# Patient Record
Sex: Female | Born: 1995 | Race: White | Hispanic: No | Marital: Single | Smoking: Current every day smoker
Health system: Southern US, Community
[De-identification: ages and names within clinical notes are randomized; demographics above are authoritative.]

## PROBLEM LIST (undated history)

## (undated) HISTORY — PX: TONSILLECTOMY: SUR1361

---

## 2015-11-22 ENCOUNTER — Encounter: Payer: Self-pay | Admitting: Emergency Medicine

## 2015-11-22 ENCOUNTER — Emergency Department: Payer: Self-pay

## 2015-11-22 ENCOUNTER — Emergency Department
Admission: EM | Admit: 2015-11-22 | Discharge: 2015-11-22 | Disposition: A | Discharge status: Home / Self Care | Payer: Self-pay | Attending: Emergency Medicine | Admitting: Emergency Medicine

## 2015-11-22 DIAGNOSIS — Y9339 Activity, other involving climbing, rappelling and jumping off: Secondary | ICD-10-CM | POA: Insufficient documentation

## 2015-11-22 DIAGNOSIS — Y999 Unspecified external cause status: Secondary | ICD-10-CM | POA: Insufficient documentation

## 2015-11-22 DIAGNOSIS — F17200 Nicotine dependence, unspecified, uncomplicated: Secondary | ICD-10-CM | POA: Insufficient documentation

## 2015-11-22 DIAGNOSIS — F1290 Cannabis use, unspecified, uncomplicated: Secondary | ICD-10-CM | POA: Insufficient documentation

## 2015-11-22 DIAGNOSIS — S92125A Nondisplaced fracture of body of left talus, initial encounter for closed fracture: Secondary | ICD-10-CM | POA: Insufficient documentation

## 2015-11-22 DIAGNOSIS — W1789XA Other fall from one level to another, initial encounter: Secondary | ICD-10-CM | POA: Insufficient documentation

## 2015-11-22 DIAGNOSIS — Y92018 Other place in single-family (private) house as the place of occurrence of the external cause: Secondary | ICD-10-CM | POA: Insufficient documentation

## 2015-11-22 DIAGNOSIS — F1910 Other psychoactive substance abuse, uncomplicated: Secondary | ICD-10-CM

## 2015-11-22 DIAGNOSIS — Z5181 Encounter for therapeutic drug level monitoring: Secondary | ICD-10-CM | POA: Insufficient documentation

## 2015-11-22 DIAGNOSIS — S92102A Unspecified fracture of left talus, initial encounter for closed fracture: Secondary | ICD-10-CM

## 2015-11-22 LAB — URINALYSIS COMPLETE WITH MICROSCOPIC (ARMC ONLY)
BACTERIA UA: NONE SEEN
BILIRUBIN URINE: NEGATIVE
GLUCOSE, UA: NEGATIVE mg/dL
HGB URINE DIPSTICK: NEGATIVE
Ketones, ur: NEGATIVE mg/dL
Leukocytes, UA: NEGATIVE
Nitrite: NEGATIVE
PH: 6 (ref 5.0–8.0)
Protein, ur: 30 mg/dL — AB
Specific Gravity, Urine: 1.021 (ref 1.005–1.030)

## 2015-11-22 LAB — URINE DRUG SCREEN, QUALITATIVE (ARMC ONLY)
AMPHETAMINES, UR SCREEN: NOT DETECTED
BARBITURATES, UR SCREEN: NOT DETECTED
BENZODIAZEPINE, UR SCRN: POSITIVE — AB
Cannabinoid 50 Ng, Ur ~~LOC~~: POSITIVE — AB
Cocaine Metabolite,Ur ~~LOC~~: POSITIVE — AB
MDMA (Ecstasy)Ur Screen: NOT DETECTED
METHADONE SCREEN, URINE: NOT DETECTED
OPIATE, UR SCREEN: POSITIVE — AB
Phencyclidine (PCP) Ur S: NOT DETECTED
TRICYCLIC, UR SCREEN: NOT DETECTED

## 2015-11-22 LAB — POCT PREGNANCY, URINE: Preg Test, Ur: NEGATIVE

## 2015-11-22 MED ORDER — IBUPROFEN 200 MG PO TABS: 600.0000 mg | ORAL_TABLET | Freq: Four times a day (QID) | ORAL | 0 refills | Status: AC | PRN

## 2015-11-22 MED ORDER — KETOROLAC TROMETHAMINE 30 MG/ML IJ SOLN
30.0000 mg | Freq: Once | INTRAMUSCULAR | Status: AC
  Administered 2015-11-22: 30 mg via INTRAVENOUS
  Filled 2015-11-22: qty 1
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
## 2015-11-22 NOTE — ED Notes (Signed)
Patient transported to CT 

## 2017-02-06 IMAGING — CR DG TIBIA/FIBULA 2V*L*
4 series · 4 of 4 positions shown · non-contrast
Comparison: None.

CLINICAL DATA: Jumped from second floor apartment. Pain and lower
extremity.

EXAM:
LEFT TIBIA AND FIBULA - 2 VIEW

[tibia ap (1 of 2)]
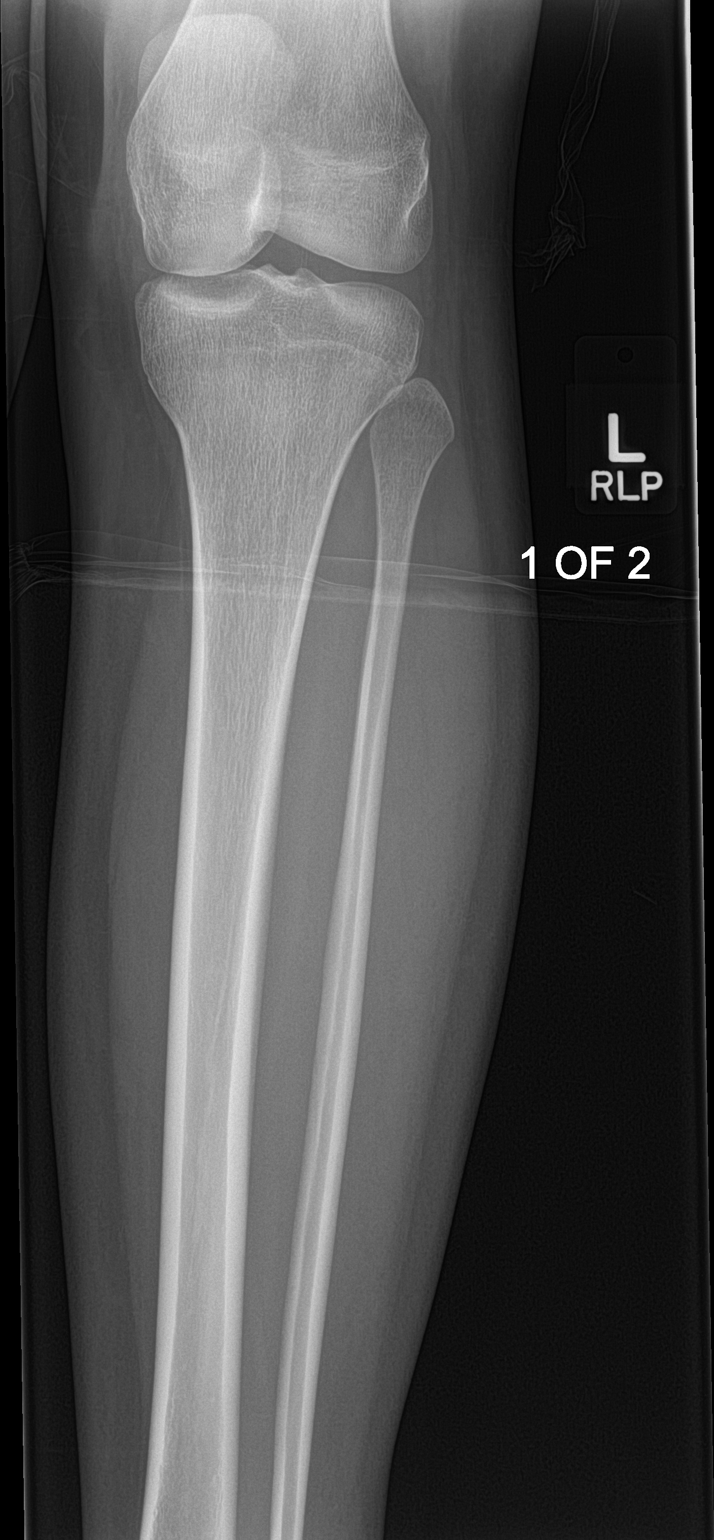

[tibia ap (2 of 2)]
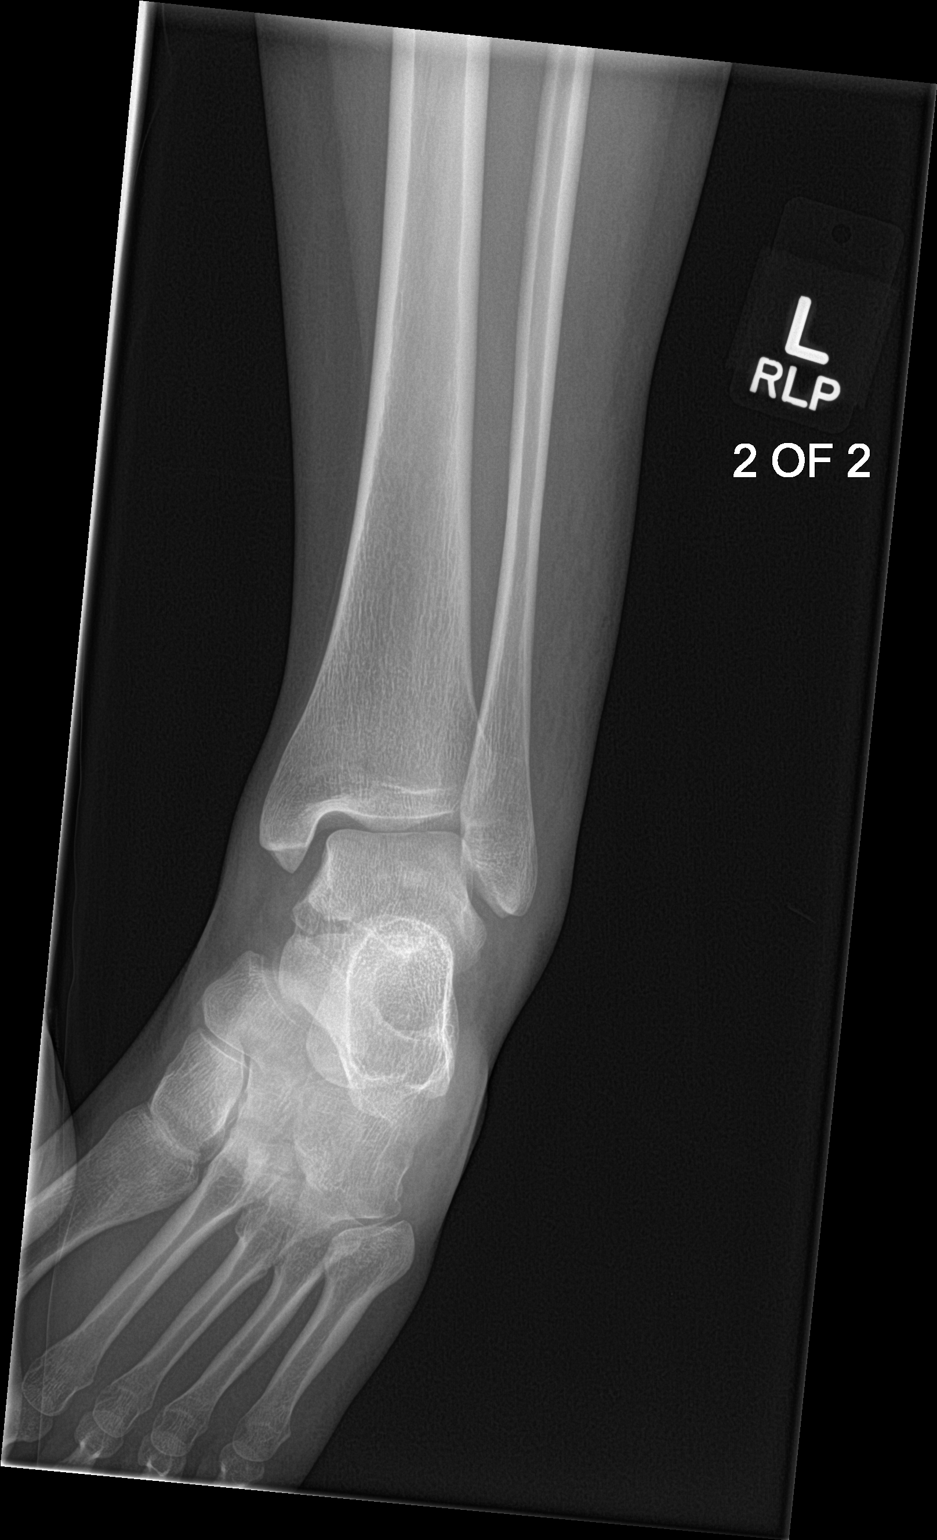

[tibia lat]
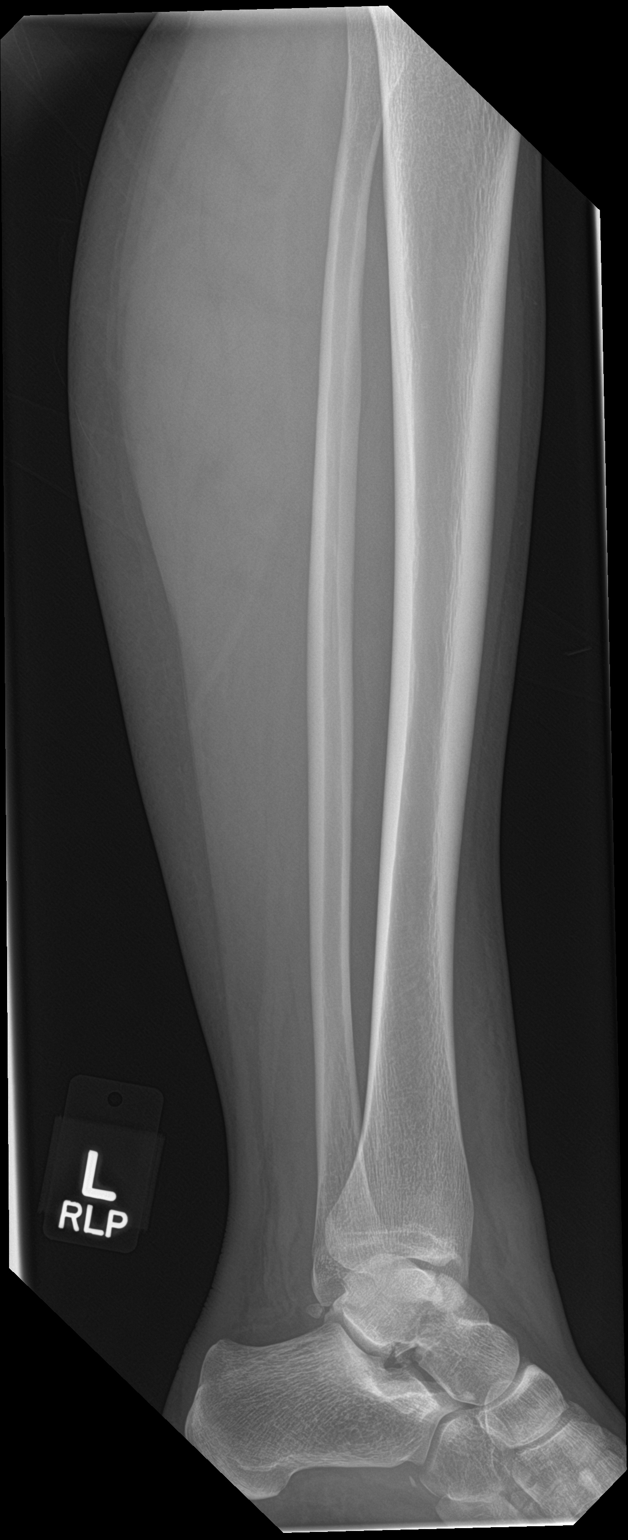

[femur lat]
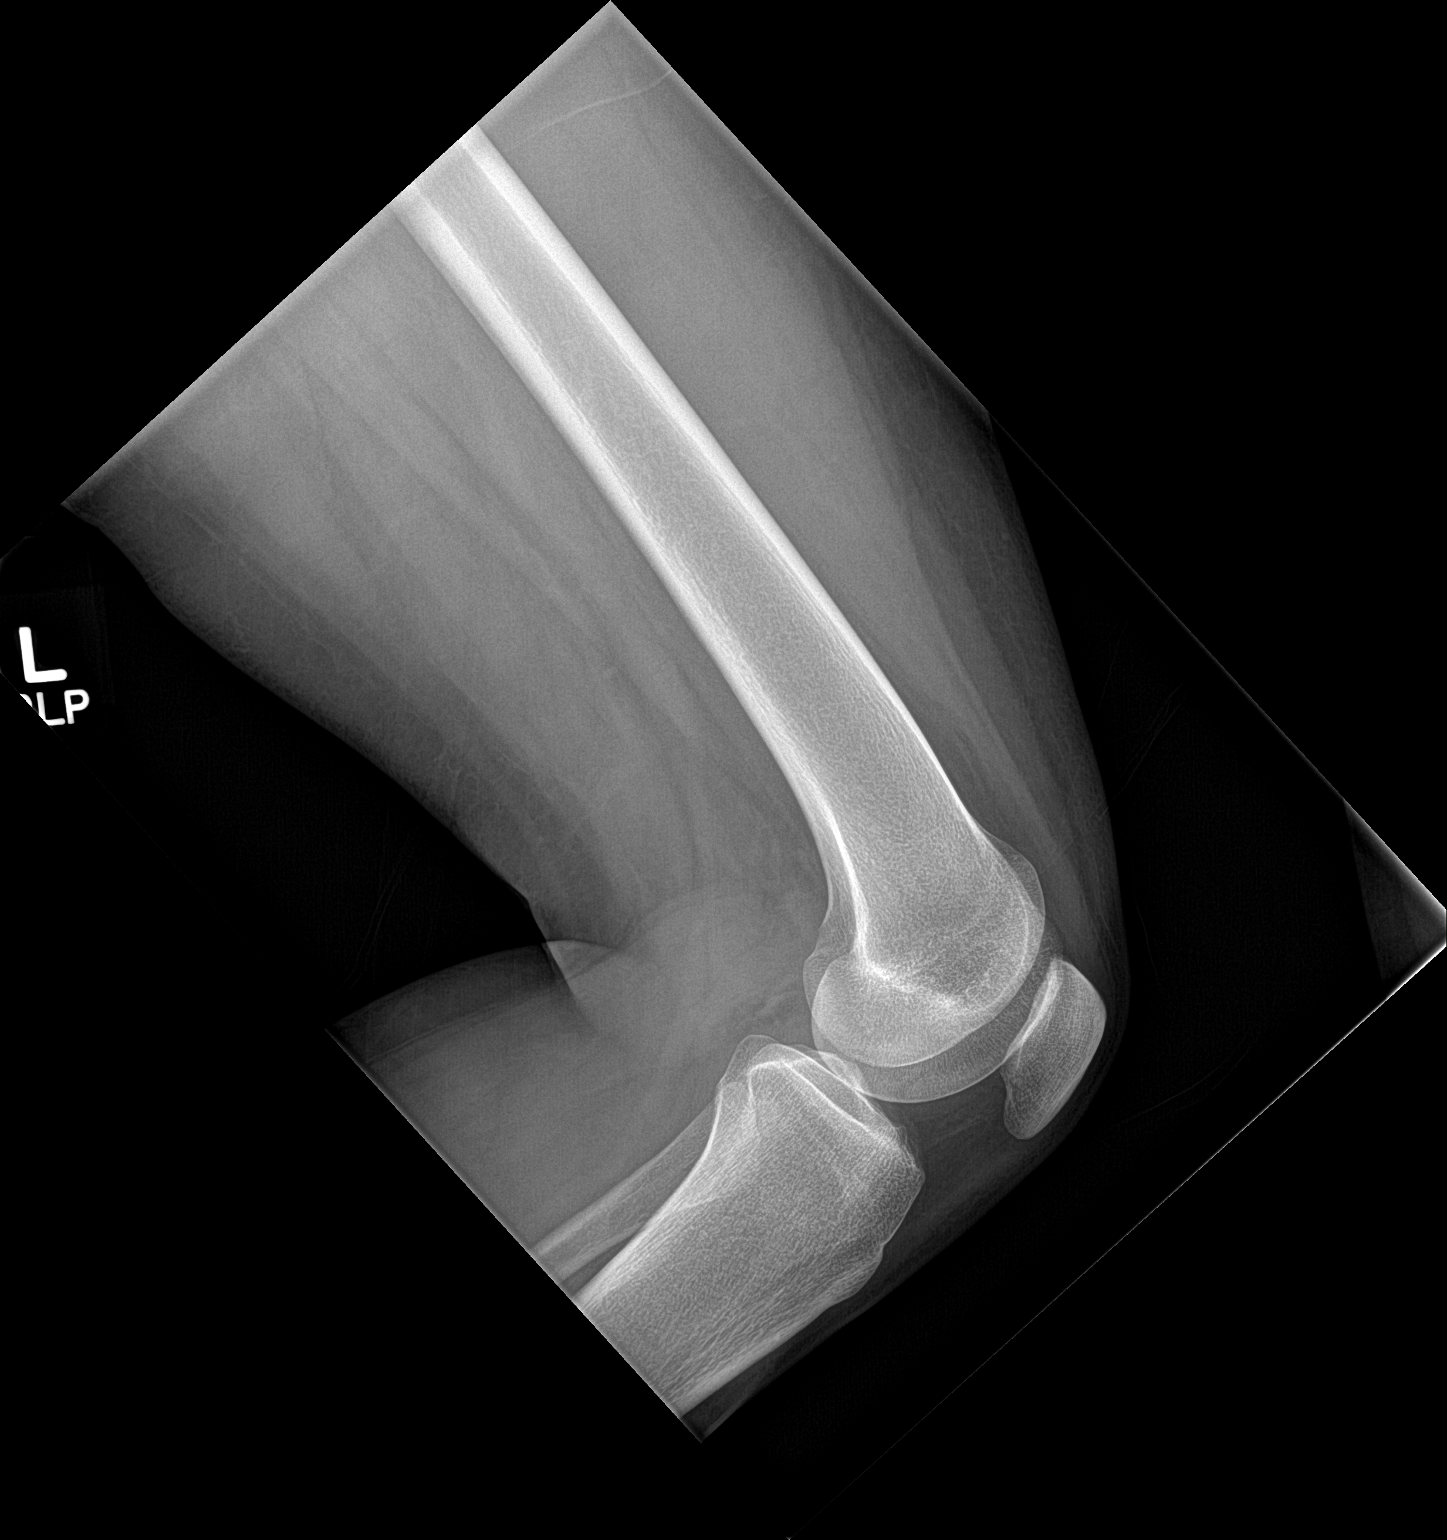

[4 of 4 positions shown; findings below may reference images not displayed]

FINDINGS: Soft tissue swelling is present at the ankle bilaterally. A coronal
fracture through the talar dome is suspected. The tibia and fibula
are otherwise intact. The knee is within normal limits.
IMPRESSION: 1. Soft swelling about the ankle without definite fracture.
Dedicated ankle radiographs or CT is recommended for further
evaluation.
2. No additional fracture within the tibia or fibula.
